# Patient Record
Sex: Male | Born: 1995 | Race: Black or African American | Hispanic: No | Marital: Single | Smoking: Never smoker
Health system: Southern US, Community
[De-identification: ages and names within clinical notes are randomized; demographics above are authoritative.]

## PROBLEM LIST (undated history)

## (undated) DIAGNOSIS — J45909 Unspecified asthma, uncomplicated: Secondary | ICD-10-CM

## (undated) HISTORY — PX: OTHER SURGICAL HISTORY: SHX169

---

## 2016-11-11 ENCOUNTER — Emergency Department (HOSPITAL_COMMUNITY)
Admission: EM | Admit: 2016-11-11 | Discharge: 2016-11-12 | Disposition: A | Discharge status: Home / Self Care | Payer: BLUE CROSS/BLUE SHIELD | Attending: Emergency Medicine | Admitting: Emergency Medicine

## 2016-11-11 ENCOUNTER — Encounter (HOSPITAL_COMMUNITY): Payer: Self-pay

## 2016-11-11 DIAGNOSIS — J45909 Unspecified asthma, uncomplicated: Secondary | ICD-10-CM | POA: Insufficient documentation

## 2016-11-11 DIAGNOSIS — R202 Paresthesia of skin: Secondary | ICD-10-CM | POA: Diagnosis not present

## 2016-11-11 DIAGNOSIS — R200 Anesthesia of skin: Secondary | ICD-10-CM | POA: Insufficient documentation

## 2016-11-11 HISTORY — DX: Unspecified asthma, uncomplicated: J45.909

## 2016-11-11 NOTE — ED Triage Notes (Signed)
States since noon today left leg numbness and pain able to bear weight steady gait noted and left hand tingling off and on clear speech noted moves all extremities. Pain in leg worse with sitting.

## 2016-11-11 NOTE — ED Notes (Signed)
Pt c/o left side numbness (face, leg, arm) onset 12:00 today. He has good pulse and movement in the extremities. He stated, "I can feel if I touch my arm and leg, but it feels numb on the inside."

## 2016-11-12 ENCOUNTER — Emergency Department (HOSPITAL_COMMUNITY): Payer: BLUE CROSS/BLUE SHIELD

## 2016-11-12 LAB — CBC WITH DIFFERENTIAL/PLATELET
BASOS ABS: 0 10*3/uL (ref 0.0–0.1)
BASOS PCT: 0 %
EOS ABS: 0.1 10*3/uL (ref 0.0–0.7)
Eosinophils Relative: 2 %
HEMATOCRIT: 46.2 % (ref 39.0–52.0)
Hemoglobin: 16 g/dL (ref 13.0–17.0)
Lymphocytes Relative: 53 %
Lymphs Abs: 3.3 10*3/uL (ref 0.7–4.0)
MCH: 31.9 pg (ref 26.0–34.0)
MCHC: 34.6 g/dL (ref 30.0–36.0)
MCV: 92.2 fL (ref 78.0–100.0)
MONO ABS: 0.5 10*3/uL (ref 0.1–1.0)
Monocytes Relative: 8 %
NEUTROS ABS: 2.3 10*3/uL (ref 1.7–7.7)
Neutrophils Relative %: 37 %
PLATELETS: 229 10*3/uL (ref 150–400)
RBC: 5.01 MIL/uL (ref 4.22–5.81)
RDW: 11.8 % (ref 11.5–15.5)
WBC: 6.2 10*3/uL (ref 4.0–10.5)

## 2016-11-12 LAB — URINALYSIS, ROUTINE W REFLEX MICROSCOPIC
BILIRUBIN URINE: NEGATIVE
Glucose, UA: NEGATIVE mg/dL
Hgb urine dipstick: NEGATIVE
KETONES UR: NEGATIVE mg/dL
LEUKOCYTES UA: NEGATIVE
NITRITE: NEGATIVE
PROTEIN: NEGATIVE mg/dL
Specific Gravity, Urine: 1.018 (ref 1.005–1.030)
pH: 6 (ref 5.0–8.0)

## 2016-11-12 LAB — RAPID URINE DRUG SCREEN, HOSP PERFORMED
Amphetamines: NOT DETECTED
BENZODIAZEPINES: NOT DETECTED
Barbiturates: NOT DETECTED
COCAINE: NOT DETECTED
OPIATES: NOT DETECTED
Tetrahydrocannabinol: NOT DETECTED

## 2016-11-12 LAB — BASIC METABOLIC PANEL
ANION GAP: 7 (ref 5–15)
BUN: 12 mg/dL (ref 6–20)
CALCIUM: 9.6 mg/dL (ref 8.9–10.3)
CO2: 28 mmol/L (ref 22–32)
CREATININE: 0.84 mg/dL (ref 0.61–1.24)
Chloride: 102 mmol/L (ref 101–111)
GFR calc Af Amer: >60 mL/min (ref >60)
GFR calc non Af Amer: >60 mL/min (ref >60)
Glucose, Bld: 91 mg/dL (ref 65–99)
Potassium: 3.7 mmol/L (ref 3.5–5.1)
Sodium: 137 mmol/L (ref 135–145)

## 2016-11-12 MED ORDER — LORAZEPAM 2 MG/ML IJ SOLN
1.0000 mg | Freq: Once | INTRAMUSCULAR | Status: AC
  Administered 2016-11-12: 1 mg via INTRAVENOUS
  Filled 2016-11-12: qty 1

## 2016-11-12 MED ORDER — SODIUM CHLORIDE 0.9 % IV BOLUS (SEPSIS)
1000.0000 mL | Freq: Once | INTRAVENOUS | Status: AC
  Administered 2016-11-12: 1000 mL via INTRAVENOUS

## 2016-11-12 NOTE — ED Notes (Signed)
[facility] here to transport pt to [facility]. 

## 2016-11-12 NOTE — ED Notes (Signed)
[facility] called and was given report on pt.

## 2016-11-12 NOTE — ED Notes (Signed)
[facility] was notified of pt's need of transportation to [facility]. 

## 2016-11-12 NOTE — ED Provider Notes (Signed)
TIME SEEN: 12:42 AM  CHIEF COMPLAINT: Left-sided numbness  HPI: Patient is a 21 year old male with history of asthma and seasonal allergies who presents emergency department with left leg numbness that started at noon today. Numbness involves the entire left leg. Has also had intermittent numbness of the left face and left arm that has currently resolved. Has had mild left-sided headache. No numbness or weakness of the right side. No history of stroke. No history of MS. No family history of MS, brain tumor, early stroke. He denies head injury. No neck or back injury. No neck or back pain. No bowel or bladder incontinence. No fever. Otherwise has been feeling well.  ROS: See HPI Constitutional: no fever  Eyes: no drainage  ENT: no runny nose   Cardiovascular:  no chest pain  Resp: no SOB  GI: no vomiting GU: no dysuria Integumentary: no rash  Allergy: no hives  Musculoskeletal: no leg swelling  Neurological: no slurred speech ROS otherwise negative  PAST MEDICAL HISTORY/PAST SURGICAL HISTORY:  Past Medical History:  Diagnosis Date  . Asthma     MEDICATIONS:  Prior to Admission medications   Not on File    ALLERGIES:  No Known Allergies  SOCIAL HISTORY:  Social History  Substance Use Topics  . Smoking status: Never Smoker  . Smokeless tobacco: Never Used  . Alcohol use Yes    FAMILY HISTORY: History reviewed. No pertinent family history.  EXAM: BP 115/84 (BP Location: Left Arm)   Pulse (!) 51   Temp 97.8 F (36.6 C) (Oral)   Resp 15   Ht 6\' 1"  (1.854 m)   Wt 77.6 kg (171 lb)   SpO2 100%   BMI 22.56 kg/m  CONSTITUTIONAL: Alert and oriented and responds appropriately to questions. Well-appearing; well-nourished HEAD: Normocephalic EYES: Conjunctivae clear, pupils appear equal, EOMI ENT: normal nose; moist mucous membranes NECK: Supple, no meningismus, no nuchal rigidity, no LAD  CARD: RRR; S1 and S2 appreciated; no murmurs, no clicks, no rubs, no  gallops RESP: Normal chest excursion without splinting or tachypnea; breath sounds clear and equal bilaterally; no wheezes, no rhonchi, no rales, no hypoxia or respiratory distress, speaking full sentences ABD/GI: Normal bowel sounds; non-distended; soft, non-tender, no rebound, no guarding, no peritoneal signs, no hepatosplenomegaly BACK:  The back appears normal and is non-tender to palpation, there is no CVA tenderness EXT: Normal ROM in all joints; non-tender to palpation; no edema; normal capillary refill; no cyanosis, no calf tenderness or swelling    SKIN: Normal color for age and race; warm; no rash NEURO: Moves all extremities equally, strength 5/5 in all 4 extremities, 2+ deep tendon reflexes in bilateral upper lower extreme is, no clonus, normal gait, normal speech, cranial nerves II through XII intact, sensation in the left leg is diminished compared to the right but otherwise sensation in the arms and face is normal PSYCH: The patient's mood and manner are appropriate. Grooming and personal hygiene are appropriate.  MEDICAL DECISION MAKING: Patient here with left-sided numbness. Intermittently involves the face and arm it is mostly the leg. Last seen normal at noon yesterday. He is not a TPA candidate given onset of symptoms 12 hours ago. Does not meet criteria for large vessel occlusion. I feel he needs an MRI of his brain for further evaluation. I feel this can be done without contrast. He will be transferred to [facility]. He agrees with this plan. We'll obtain screening labs, urine. He declines pain medication for his headache at  this time. Describes the headache is very "mild" like he "has not drank enough water today".  No history of migraines.  ED PROGRESS:   12:40 AM  D/w Dr[facility] Preston [facility]Glick [facility] department who agrees to accept patient in transfer. If MRI brain negative, patient will be discharged with outpatient neurology follow-up. Patient comfortable with  this plan.   I reviewed all nursing notes, vitals, pertinent previous records, EKGs, lab and urine results, imaging (as available).    Tyshawn Ciullo, Layla MawKristen N, DO 11/12/16 [phone]

## 2016-11-12 NOTE — Discharge Instructions (Signed)
1. Medications: usual home medications 2. Treatment: rest, drink plenty of fluids,  3. Follow Up: Please followup with neurology in 3-5 days for discussion of your diagnoses and further evaluation after today's visit; if you do not have a primary care doctor use the resource guide provided to find one; Please return to the ER for worsening symptoms, difficulty walking, loss of bowel or bladder control

## 2016-11-12 NOTE — ED Notes (Signed)
Pt arrived via [facility] to hallway

## 2016-11-12 NOTE — ED Provider Notes (Signed)
[facility] EM[facility]GENCY [facility] Provider Note   CSN: [id number] Arrival date & time: 11/11/16  2040     History   Chief Complaint Chief Complaint  Patient presents with  . Leg Pain    HPI Jim DesanctisSteve Knobel is a 21 y.o. male with a hx of Asthma and seasonal allergies presents to the Emergency Department complaining of gradual, persistent, progressively worsening left leg numbness onset around noon today. He has had intermittent numbness of the left face and left arm that has resolved. Patient denies a history of stroke or MS. He was [facility] earlier tonight and transferred here to [facility] for [facility].  The history is provided by the patient and medical records. No language interpreter was used.    Past Medical History:  Diagnosis Date  . Asthma     There are no active problems to display for this patient.   Past Surgical History:  Procedure Laterality Date  . headaches         Home Medications    Prior to Admission medications   Not on File    Family History History reviewed. No pertinent family history.  Social History Social History  Substance Use Topics  . Smoking status: Never Smoker  . Smokeless tobacco: Never Used  . Alcohol use Yes     Allergies   Patient has no known allergies.   Review of Systems Review of Systems  Constitutional: Negative for appetite change, diaphoresis, fatigue, fever and unexpected weight change.  HENT: Negative for mouth sores.   Eyes: Negative for visual disturbance.  Respiratory: Negative for cough, chest tightness, shortness of breath and wheezing.   Cardiovascular: Negative for chest pain.  Gastrointestinal: Negative for abdominal pain, constipation, diarrhea, nausea and vomiting.  Endocrine: Negative for polydipsia, polyphagia and polyuria.  Genitourinary: Negative for dysuria, frequency, hematuria and urgency.  Musculoskeletal: Negative for back pain and neck stiffness.    Skin: Negative for rash.  Allergic/Immunologic: Negative for immunocompromised state.  Neurological: Positive for numbness and headaches. Negative for syncope and light-headedness.  Hematological: Does not bruise/bleed easily.  Psychiatric/Behavioral: Negative for sleep disturbance. The patient is not nervous/anxious.      Physical Exam Updated Vital Signs BP 121/78   Pulse (!) 51   Temp 97.8 F (36.6 C) (Oral)   Resp 18   Ht 6\' 1"  (1.854 m)   Wt 77.6 kg (171 lb)   SpO2 100%   BMI 22.56 kg/m   Physical Exam  Constitutional: He is oriented to person, place, and time. He appears well-developed and well-nourished. No distress.  HENT:  Head: Normocephalic and atraumatic.  Mouth/Throat: Oropharynx is clear and moist.  Eyes: Pupils are equal, round, and reactive to light. Conjunctivae and EOM are normal. No scleral icterus.  No horizontal, vertical or rotational nystagmus  Neck: Normal range of motion. Neck supple.  Full active and passive ROM without pain No midline or paraspinal tenderness No nuchal rigidity or meningeal signs  Cardiovascular: Normal rate, regular rhythm and intact distal pulses.   Pulmonary/Chest: Effort normal and breath sounds normal. No respiratory distress. He has no wheezes. He has no rales.  Abdominal: Soft. Bowel sounds are normal. There is no tenderness. There is no rebound and no guarding.  Musculoskeletal: Normal range of motion.  Lymphadenopathy:    He has no cervical adenopathy.  Neurological: He is alert and oriented to person, place, and time. No cranial nerve deficit. He exhibits normal muscle tone. Coordination normal.  Mental Status:  Alert, oriented, thought content appropriate. Speech fluent without evidence of aphasia. Able to follow 2 step commands without difficulty.  Cranial Nerves:  II:  Peripheral visual fields grossly normal, pupils equal, round, reactive to light III,IV, VI: ptosis not present, extra-ocular motions intact  bilaterally  V,VII: smile symmetric, facial light touch sensation equal VIII: hearing grossly normal bilaterally  IX,X: midline uvula rise  XI: bilateral shoulder shrug equal and strong XII: midline tongue extension  Motor:  5/5 in upper and lower extremities bilaterally including strong and equal grip strength and dorsiflexion/plantar flexion Sensory: normal touch bilateral face and upper extremities, reports slightly decreased touch to the left lower extremity when compared to the right lower.  Gait: gait testing deferred CV: distal pulses palpable throughout   Skin: Skin is warm and dry. No rash noted. He is not diaphoretic.  Psychiatric: He has a normal mood and affect. His behavior is normal. Judgment and thought content normal.  Nursing note and vitals reviewed.    ED Treatments / Results  Labs (all labs ordered are listed, but only abnormal results are displayed) Labs Reviewed  CBC WITH DIFFERENTIAL/PLATELET  BASIC METABOLIC PANEL  URINALYSIS, ROUTINE W REFLEX MICROSCOPIC  RAPID URINE DRUG SCREEN, HOSP PERFORMED     Radiology Mr Brain Wo Contrast  Result Date: 11/12/2016 CLINICAL DATA:  21 y/o M; left leg numbness and pain. On and off left hand tingling. EXAM: MRI HEAD WITHOUT CONTRAST TECHNIQUE: Multiplanar, multiecho pulse sequences of the brain and surrounding structures were obtained without intravenous contrast. COMPARISON:  None. FINDINGS: Brain: No acute infarction, hemorrhage, hydrocephalus, extra-axial collection or mass lesion. Vascular: Normal flow voids. Skull and upper cervical spine: Normal marrow signal. Sinuses/Orbits: Negative. Other: None. IMPRESSION: Normal MRI of the brain. Electronically Signed   By: Mitzi Hansen M.D.   On: 11/12/2016 03:41    Procedures Procedures (including critical care time)  Medications Ordered in ED Medications  sodium chloride 0.9 % bolus 1,000 mL (0 mLs Intravenous Stopped 11/12/16 0242)  LORazepam (ATIVAN)  injection 1 mg (1 mg Intravenous Given 11/12/16 0230)     Initial Impression / Assessment and Plan / ED Course  I have reviewed the triage vital signs and the nursing notes.  Pertinent labs & imaging results that were available during my care of the patient were reviewed by me and considered in my medical decision making (see chart for details).     Patient presents as a transfer from that center [facility] with paresthesias of the left leg. Subjectively decreased sensation to the but no other neurologic deficits. MRI without acute abnormality.No evidence of stroke, white matter lesions or tumors. Patient is able to ambulate here. Discussed conservative therapies at home and close follow-up with neurology. Also discussed reasons to return immediately to the emergency department. Patient states understanding and is in agreement with the plan.  Final Clinical Impressions(s) / ED Diagnoses   Final diagnoses:  Left sided numbness  Paresthesias    New Prescriptions New Prescriptions   No medications on file     Milta Deiters 11/12/16 [id number]    Dione Booze, MD 11/12/16 [phone]

## 2016-12-19 ENCOUNTER — Emergency Department (HOSPITAL_COMMUNITY): Payer: BLUE CROSS/BLUE SHIELD

## 2016-12-19 ENCOUNTER — Emergency Department (HOSPITAL_COMMUNITY)
Admission: EM | Admit: 2016-12-19 | Discharge: 2016-12-19 | Disposition: A | Discharge status: Home / Self Care | Payer: BLUE CROSS/BLUE SHIELD | Attending: Emergency Medicine | Admitting: Emergency Medicine

## 2016-12-19 ENCOUNTER — Other Ambulatory Visit: Payer: Self-pay

## 2016-12-19 ENCOUNTER — Encounter (HOSPITAL_COMMUNITY): Payer: Self-pay | Admitting: Emergency Medicine

## 2016-12-19 ENCOUNTER — Emergency Department (HOSPITAL_BASED_OUTPATIENT_CLINIC_OR_DEPARTMENT_OTHER): Payer: BLUE CROSS/BLUE SHIELD

## 2016-12-19 DIAGNOSIS — I301 Infective pericarditis: Secondary | ICD-10-CM | POA: Insufficient documentation

## 2016-12-19 DIAGNOSIS — J45909 Unspecified asthma, uncomplicated: Secondary | ICD-10-CM | POA: Insufficient documentation

## 2016-12-19 DIAGNOSIS — I309 Acute pericarditis, unspecified: Secondary | ICD-10-CM | POA: Diagnosis not present

## 2016-12-19 DIAGNOSIS — R9431 Abnormal electrocardiogram [ECG] [EKG]: Secondary | ICD-10-CM

## 2016-12-19 DIAGNOSIS — R079 Chest pain, unspecified: Secondary | ICD-10-CM | POA: Diagnosis present

## 2016-12-19 DIAGNOSIS — R071 Chest pain on breathing: Secondary | ICD-10-CM | POA: Diagnosis not present

## 2016-12-19 LAB — BASIC METABOLIC PANEL
ANION GAP: 7 (ref 5–15)
BUN: 12 mg/dL (ref 6–20)
CALCIUM: 9.6 mg/dL (ref 8.9–10.3)
CO2: 27 mmol/L (ref 22–32)
Chloride: 105 mmol/L (ref 101–111)
Creatinine, Ser: 0.78 mg/dL (ref 0.61–1.24)
Glucose, Bld: 88 mg/dL (ref 65–99)
POTASSIUM: 3.7 mmol/L (ref 3.5–5.1)
Sodium: 139 mmol/L (ref 135–145)

## 2016-12-19 LAB — I-STAT TROPONIN, ED: TROPONIN I, POC: 0.13 ng/mL — AB (ref 0.00–0.08)

## 2016-12-19 LAB — CBC
HCT: 45.2 % (ref 39.0–52.0)
HEMOGLOBIN: 14.9 g/dL (ref 13.0–17.0)
MCH: 30.8 pg (ref 26.0–34.0)
MCHC: 33 g/dL (ref 30.0–36.0)
MCV: 93.4 fL (ref 78.0–100.0)
Platelets: 231 10*3/uL (ref 150–400)
RBC: 4.84 MIL/uL (ref 4.22–5.81)
RDW: 11.8 % (ref 11.5–15.5)
WBC: 5.3 10*3/uL (ref 4.0–10.5)

## 2016-12-19 LAB — ECHOCARDIOGRAM COMPLETE
Height: 73 in
WEIGHTICAEL: 2736 [oz_av]

## 2016-12-19 LAB — TROPONIN I: TROPONIN I: 0.12 ng/mL — AB (ref <0.03)

## 2016-12-19 MED ORDER — IBUPROFEN 400 MG PO TABS: 400.0000 mg | ORAL_TABLET | Freq: Two times a day (BID) | ORAL | 0 refills | Status: AC

## 2016-12-19 MED ORDER — COLCHICINE 0.6 MG PO TABS: 0.6000 mg | ORAL_TABLET | Freq: Two times a day (BID) | ORAL | 0 refills | Status: AC

## 2016-12-19 MED ORDER — COLCHICINE 0.6 MG PO TABS
0.6000 mg | ORAL_TABLET | Freq: Two times a day (BID) | ORAL | Status: DC
  Administered 2016-12-19: 0.6 mg via ORAL
  Filled 2016-12-19: qty 1

## 2016-12-19 MED ORDER — IBUPROFEN 200 MG PO TABS
600.0000 mg | ORAL_TABLET | Freq: Once | ORAL | Status: AC
  Administered 2016-12-19: 600 mg via ORAL
  Filled 2016-12-19: qty 3

## 2016-12-19 MED ORDER — IBUPROFEN 200 MG PO TABS: 400.0000 mg | ORAL_TABLET | Freq: Two times a day (BID) | ORAL | Status: DC

## 2016-12-19 MED ORDER — ASPIRIN 81 MG PO CHEW: 324.0000 mg | CHEWABLE_TABLET | Freq: Once | ORAL | Status: DC

## 2016-12-19 NOTE — ED Provider Notes (Signed)
Patient has been seen by cardiology and prescribed medications and given referral information   Lorre NickAllen, Tesa Meadors, MD 12/19/16 1318

## 2016-12-19 NOTE — ED Notes (Signed)
Delay in discharge, waiting for colchicine prescription and tablet to arrive to the ED

## 2016-12-19 NOTE — Consult Note (Signed)
Cardiology Consultation:   Patient ID: Mitchell Crosby; [id number]; 12-16-1995   Admit date: 12/19/2016 Date of Consult: 12/19/2016  Primary Care Provider: Patient, No Pcp Per Primary Cardiologist: New- Dr. Delton See   Patient Profile:   Mitchell Crosby is a 21 y.o. male with a hx of asthma who is being seen today for the evaluation of chest pain at the request of Dr. Freida Busman.  History of Present Illness:   Mr. Damore has no previous history of CAD or PE. He is a Education officer, museum at [employer]. Last week he developed headache, body aches and dry throat. He went to student health and was told that he had a viral illness. 2 nights ago he developed left sided chest pain that felt like waves of pain lasting for a few seconds at a time and then developed associated left arm tingling. This pain bothered him to the point that he was repeatedly awakened about every thirty minutes at which time he would get up and walk around and the pain resolved. He has had no pain during the day when he is up and active. Last night the pain became much worse. He has had a similar episode of chest pain when he was in high school when he had a viral illness, that self resolved without treatment.   Troponin is midly elevated at 0.12. EKG shows diffuse ST elevations suggesting acute pericarditis.   Mr. Dalziel drinks alcohol socially, liquor or wine on the weekends. He does not use tobacco but occ smokes marijuana, last use in September. The patient is not aware of any cardiac family history in his parents, although he thinks his mother sees a cardiologist.    Past Medical History:  Diagnosis Date  . Asthma     Past Surgical History:  Procedure Laterality Date  . headaches       Home Medications:  Prior to Admission medications   Not on File    Inpatient Medications: Scheduled Meds:  Continuous Infusions:  PRN Meds:   Allergies:   No Known Allergies  Social History:   Social History    Socioeconomic History  . Marital status: Single    Spouse name: Not on file  . Number of children: Not on file  . Years of education: Not on file  . Highest education level: Not on file  Social Needs  . Financial resource strain: Not on file  . Food insecurity - worry: Not on file  . Food insecurity - inability: Not on file  . Transportation needs - medical: Not on file  . Transportation needs - non-medical: Not on file  Occupational History  . Not on file  Tobacco Use  . Smoking status: Never Smoker  . Smokeless tobacco: Never Used  Substance and Sexual Activity  . Alcohol use: Yes    Comment: Socially on weekends  . Drug use: Yes    Types: Marijuana    Comment: Occasional- last use in 09/2016  . Sexual activity: Not on file  Other Topics Concern  . Not on file  Social History Narrative  . Not on file    Family History:    Family History  Problem Relation Age of Onset  . Hypertension Mother      ROS:  Please see the history of present illness.  ROS  All other ROS reviewed and negative.     Physical Exam/Data:   Vitals:   12/19/16 0700 12/19/16 0730 12/19/16 0800 12/19/16 0830  BP: 102/84 123/83 114/65 129/81  Pulse: 69 (!) 56 (!) 58 70  Resp: (!) 21 16 14  (!) 21  Temp:      TempSrc:      SpO2: 100% 100% 100% 98%  Weight:      Height:       No intake or output data in the 24 hours ending 12/19/16 0857 Filed Weights   12/19/16 0425  Weight: 171 lb (77.6 kg)   Body mass index is 22.56 kg/m.  General:  Well nourished, well developed, in no acute distress HEENT: normal Lymph: no adenopathy Neck: no JVD Endocrine:  No thryomegaly Vascular: No carotid bruits; FA pulses 2+ bilaterally without bruits  Cardiac:  normal S1, S2; RRR; no murmur  Lungs:  clear to auscultation bilaterally, no wheezing, rhonchi or rales  Abd: soft, nontender, no hepatomegaly  Ext: no edema Musculoskeletal:  No deformities, BUE and BLE strength normal and equal Skin: warm  and dry  Neuro:  CNs 2-12 intact, no focal abnormalities noted Psych:  Normal affect   EKG:  The EKG was personally reviewed and demonstrates:  sinus bradycardia with  diffuse ST elevations suggesting acute pericarditis, 53 bpm.  Telemetry:  Telemetry was personally reviewed and demonstrates:  SB/SR 50's-60's  Relevant CV Studies:  Echo pending  Laboratory Data:  Chemistry Recent Labs  Lab 12/19/16 0507  NA 139  K 3.7  CL 105  CO2 27  GLUCOSE 88  BUN 12  CREATININE 0.78  CALCIUM 9.6  GFRNONAA >60  GFRAA >60  ANIONGAP 7    No results for input(s): PROT, ALBUMIN, AST, ALT, ALKPHOS, BILITOT in the last 168 hours. Hematology Recent Labs  Lab 12/19/16 0507  WBC 5.3  RBC 4.84  HGB 14.9  HCT 45.2  MCV 93.4  MCH 30.8  MCHC 33.0  RDW 11.8  PLT 231   Cardiac Enzymes Recent Labs  Lab 12/19/16 0507  TROPONINI 0.12*    Recent Labs  Lab 12/19/16 0519  TROPIPOC 0.13*    BNPNo results for input(s): BNP, PROBNP in the last 168 hours.  DDimer No results for input(s): DDIMER in the last 168 hours.  Radiology/Studies:  Dg Chest 2 View  Result Date: 12/19/2016 CLINICAL DATA:  LEFT chest pain for two nights while lying down. EXAM: CHEST  2 VIEW COMPARISON:  None. FINDINGS: Cardiomediastinal silhouette is normal. No pleural effusions or focal consolidations. Trachea projects midline and there is no pneumothorax. Soft tissue planes and included osseous structures are non-suspicious. Mild dextroscoliosis IMPRESSION: Negative. Electronically Signed   By: Awilda Metroourtnay  Bloomer M.D.   On: 12/19/2016 05:52    Assessment and Plan:   Chest pain -Pt with recent viral illness, headache/body aches. Developed waves of left chest pain 2 nights ago that occurs only when he lays down that has made him unable to sleep.  -No current chest pain. Hemodynamically stable.  -EKG with diffuse ST elevations suggesting acute pericarditis. Pt with recent viral illness last week. Mildly elevated  troponin of 0.13.  -Likely myopericarditis. Will check echo. Will consider antiinflammatory therapy and PPI for 3 months with activity restrictions for the next 3 months. Follow up in our office in 6 weeks.  -Dr. Delton SeeNelson to review echo and make ultimate recommendations.   For questions or updates, please contact [facility] Please consult [email or web address] for contact info under Cardiology/STEMI.   Signed, Berton BonJanine Hammond, NP  12/19/2016 8:57 AM   The patient was seen, examined and discussed with Berton BonJanine Hammond, NP-C and I agree with the above.   A  21 y.o. male with a hx of asthma who is being seen today for the evaluation of chest pain. 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 Last week he developed URI, student health was told that he had a viral illness. 2 nights ago he developed left sided chest pain that felt like waves of pain lasting for a few seconds at a time and then developed associated left arm tingling, worse when he lays on  His back , better when upright.  ECG shows SR, diffuse ST elevations in the a pattern of acue pericarditis, troponin is minimally elevated consistent with a mild case of an acute myopericarditis. LVEF 55%, with no regional wall motion abnormalities, no pericardial effusion.   We will start ibuprofen 400 mg po BID x 7 days and colchicine 0.6 mg PO BID x 3 months. We will follow up in the clinic. He can be discharged from the ER.   Tobias AlexanderKatarina Juniel Groene, MD 12/19/2016

## 2016-12-19 NOTE — ED Notes (Signed)
I gave critical I stat cTnl result to [facility]

## 2016-12-19 NOTE — ED Provider Notes (Signed)
[facility] Provider Note   CSN: [id number] Arrival date & time: 12/19/16  0413     History   Chief Complaint Chief Complaint  Patient presents with  . Chest Pain    HPI Jim DesanctisSteve Beshara is a 21 y.o. male.  The history is provided by the patient.  Chest Pain   This is a new problem. The current episode started yesterday. The problem has been gradually improving. Pain location: left chest. The pain is moderate. The quality of the pain is described as pressure-like. The symptoms are aggravated by certain positions. Associated symptoms include shortness of breath. Pertinent negatives include no fever and no hemoptysis. He has tried nothing for the symptoms. Risk factors include male gender.  Pertinent negatives for past medical history include no CAD and no PE.   Patient reports for the past day he has had episodes of left-sided chest pain He reports that times when he has the chest pain his left arm goes numb No pleuritic pain No fever/vomiting No recent travel No history of CAD or PE He is a non-smoker Reports at times the pain is improved with movement however does get worse when he lies flat  He reports he is currently chest pain-free  Patient does report he had a recent viral illness last week during the Thanksgiving holiday.  Past Medical History:  Diagnosis Date  . Asthma     There are no active problems to display for this patient.   Past Surgical History:  Procedure Laterality Date  . headaches         Home Medications    Prior to Admission medications   Not on File    Family History No family history on file.  Social History Social History   Tobacco Use  . Smoking status: Never Smoker  . Smokeless tobacco: Never Used  Substance Use Topics  . Alcohol use: Yes  . Drug use: No     Allergies   Patient has no known allergies.   Review of Systems Review of Systems  Constitutional: Negative for fever.    Respiratory: Positive for shortness of breath. Negative for hemoptysis.   Cardiovascular: Positive for chest pain.  Neurological: Negative for syncope.  All other systems reviewed and are negative.    Physical Exam Updated Vital Signs BP 125/69   Pulse (!) 53   Temp 98.2 F (36.8 C) (Oral)   Resp 10   Ht 1.854 m (6\' 1" )   Wt 77.6 kg (171 lb)   SpO2 100%   BMI 22.56 kg/m   Physical Exam CONSTITUTIONAL: Well developed/well nourished HEAD: Normocephalic/atraumatic EYES: EOMI/PERRL ENMT: Mucous membranes moist NECK: supple no meningeal signs SPINE/BACK:entire spine nontender CV: S1/S2 noted, no murmurs/rubs/gallops noted LUNGS: Lungs are clear to auscultation bilaterally, no apparent distress ABDOMEN: soft, nontender, no rebound or guarding, bowel sounds noted throughout abdomen GU:no cva tenderness NEURO: Pt is awake/alert/appropriate, moves all extremitiesx4.  No facial droop.  No focal weakness in the upper and lower extremities, no focal sensory deficit in upper or lower extremity EXTREMITIES: pulses normal/equal, full ROM, no lower extremity edema or tenderness SKIN: warm, color normal PSYCH: no abnormalities of mood noted, alert and oriented to situation   ED Treatments / Results  Labs (all labs ordered are listed, but only abnormal results are displayed) Labs Reviewed  TROPONIN I - Abnormal; Notable for the following components:      Result Value   Troponin I 0.12 (*)    All other  components within normal limits  I-STAT TROPONIN, ED - Abnormal; Notable for the following components:   Troponin i, poc 0.13 (*)    All other components within normal limits  BASIC METABOLIC PANEL  CBC    EKG  EKG Interpretation  Date/Time:  Wednesday December 19 2016 05:32:37 EST Ventricular Rate:  53 PR Interval:    QRS Duration: 104 QT Interval:  398 QTC Calculation: 374 R Axis:   88 Text Interpretation:  Sinus rhythm Borderline Q waves in lateral leads ST elevation  suggests acute pericarditis Confirmed by Zadie RhineWickline, Tymeshia Awan ([id number]) on 12/19/2016 5:48:34 AM       Radiology Dg Chest 2 View  Result Date: 12/19/2016 CLINICAL DATA:  LEFT chest pain for two nights while lying down. EXAM: CHEST  2 VIEW COMPARISON:  None. FINDINGS: Cardiomediastinal silhouette is normal. No pleural effusions or focal consolidations. Trachea projects midline and there is no pneumothorax. Soft tissue planes and included osseous structures are non-suspicious. Mild dextroscoliosis IMPRESSION: Negative. Electronically Signed   By: Awilda Metroourtnay  Bloomer M.D.   On: 12/19/2016 05:52    Procedures Procedures   Medications Ordered in ED Medications  ibuprofen (ADVIL,MOTRIN) tablet 600 mg (600 mg Oral Given 12/19/16 0540)     Initial Impression / Assessment and Plan / ED Course  I have reviewed the triage vital signs and the nursing notes.  Pertinent labs & imaging results that were available during my care of the patient were reviewed by me and considered in my medical decision making (see chart for details).    5:45 AM Patient emergency department for chest pain that seems to be worse with lying flat. Patient is currently chest pain-free EKG does reflect possible pericarditis, his troponin is elevated I do not feel this is a STEMI at this time Imaging labs pending 6:58 AM Patient awake and alert, no distress noted Patient does report he had another episode of chest pain while lying flat  EKG consistent with diffuse ST elevation probable pericarditis He does have elevated troponin I am concerned for possible myocarditis, does endorse a recent viral illness that has resolved Discussed case with Dr. Eldridge DaceVaranasi with cardiology Plan to obtain stat echocardiogram in the emergency department, and cardiology will see patient in the emergency department  Patient has been updated on plan 7:38 AM Signed out to dr Freida Busmanallen with echo and cardiology evaluation pending  Final Clinical  Impressions(s) / ED Diagnoses   Final diagnoses:  Acute viral pericarditis    ED Discharge Orders    None       Zadie RhineWickline, Primrose Oler, MD 12/19/16 [phone]

## 2016-12-19 NOTE — ED Notes (Signed)
ECHO at bedside.

## 2016-12-19 NOTE — ED Notes (Signed)
Pt left before signature was obtained. Pt verbalized understanding of d/c instructions, follow up care, instruction on how to obtain a PCP, and prescriptions. A/Ox4, ambulatiory. All belongings with patient upon departure.

## 2016-12-19 NOTE — Progress Notes (Signed)
  Echocardiogram 2D Echocardiogram has been performed.  Leta JunglingCooper, Sherrick Araki M 12/19/2016, 8:50 AM

## 2016-12-19 NOTE — ED Triage Notes (Signed)
Pt coming from home with complaint of left sided chest pain. Pt states the pain is more severe when he tries to lay down to sleep, and that the pain goes away when he is walking around. Pt states he was seen here a few weeks ago because the left side of his body had gone numb. Was referred to a neurologist but has not been abel to make an appointment due to availability. Pt am,bulatory at this time with noe deficits. Pt A&O x 4.

## 2016-12-28 ENCOUNTER — Encounter: Payer: Self-pay | Admitting: Cardiology

## 2017-01-18 ENCOUNTER — Ambulatory Visit: Payer: BLUE CROSS/BLUE SHIELD | Admitting: Cardiology

## 2017-01-21 ENCOUNTER — Encounter: Payer: Self-pay | Admitting: Cardiology

## 2018-05-27 IMAGING — CR DG CHEST 2V
2 series · 2 of 2 positions shown · non-contrast
Comparison: None.

CLINICAL DATA: LEFT chest pain for two nights while lying down.

EXAM:
CHEST  2 VIEW

[w chest pa]
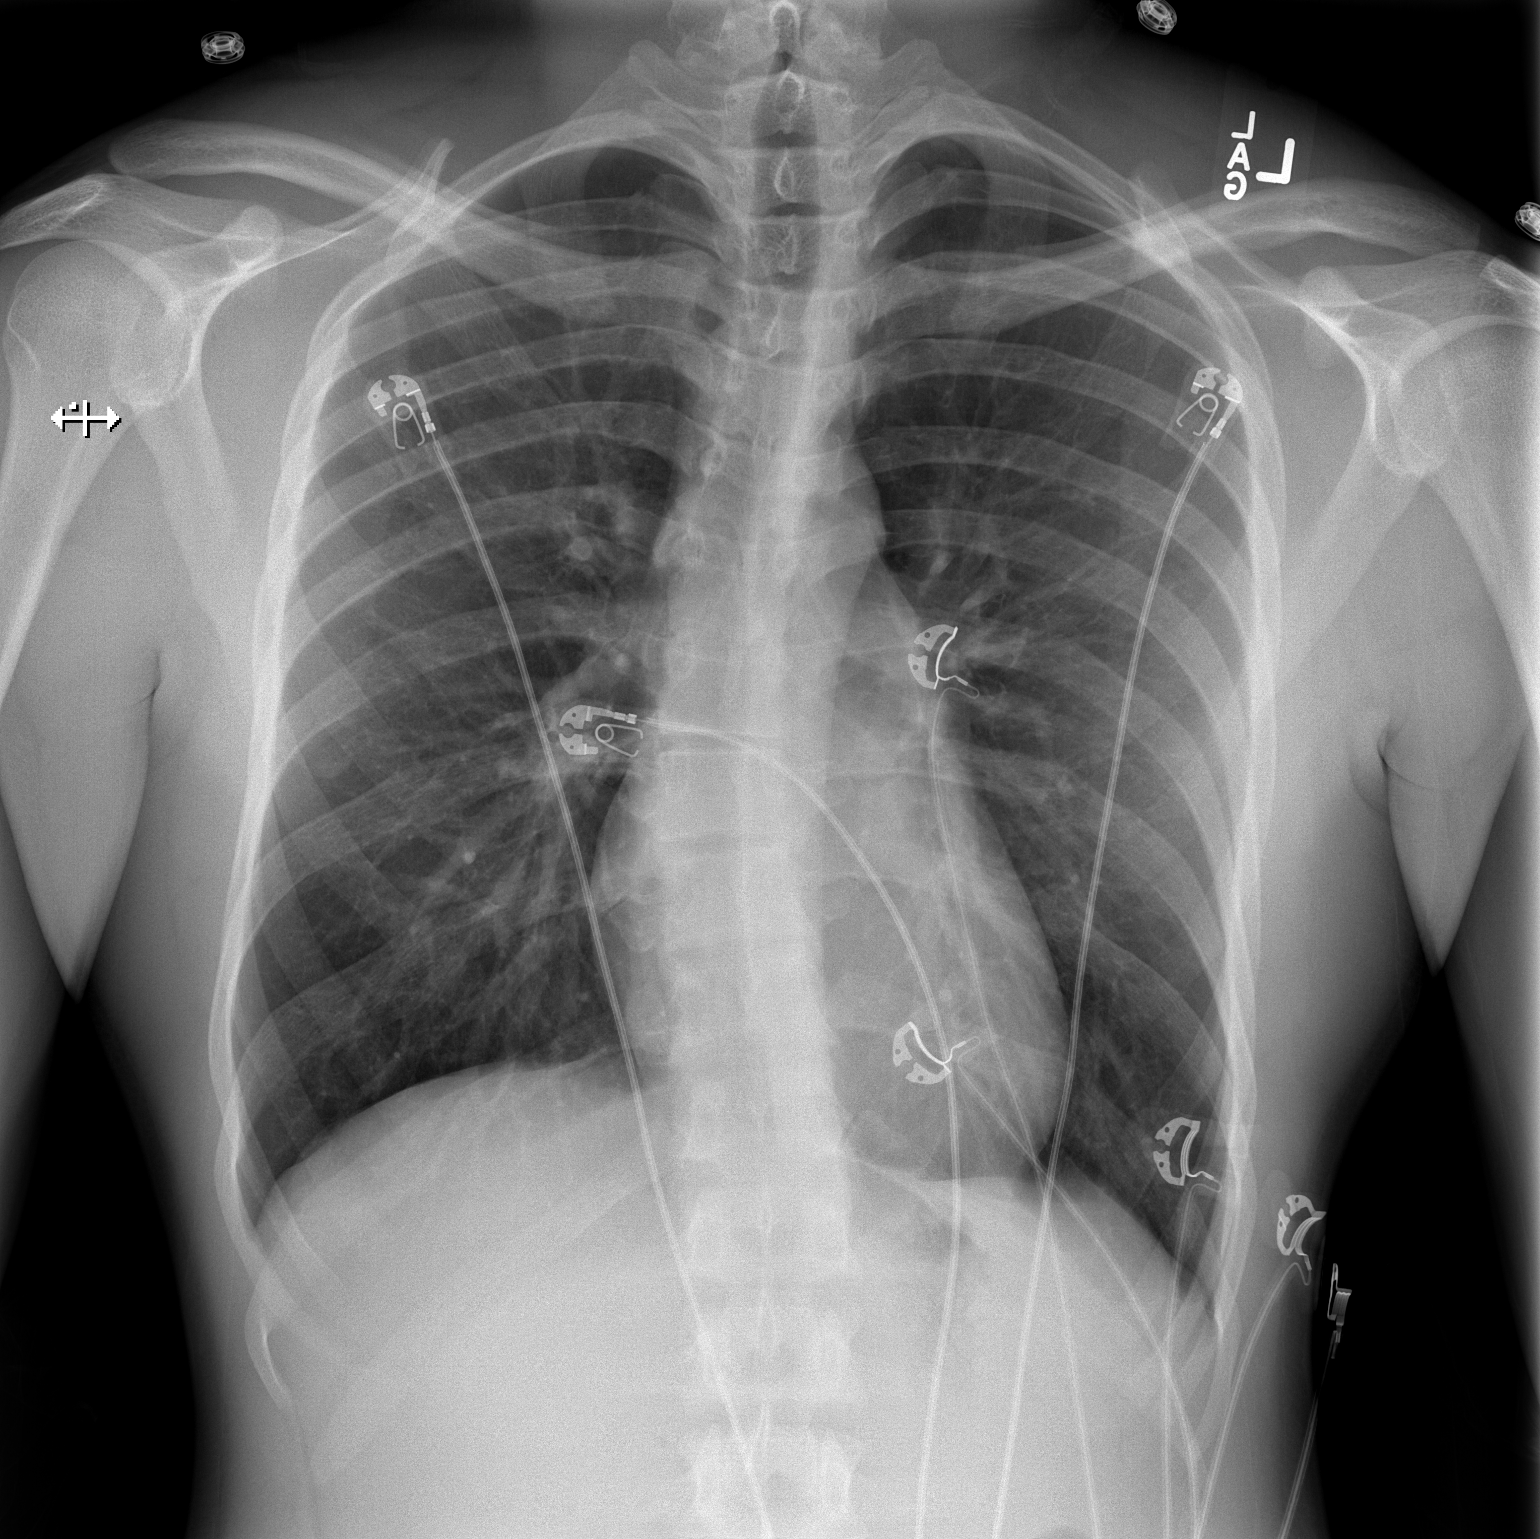

[w chest lat]
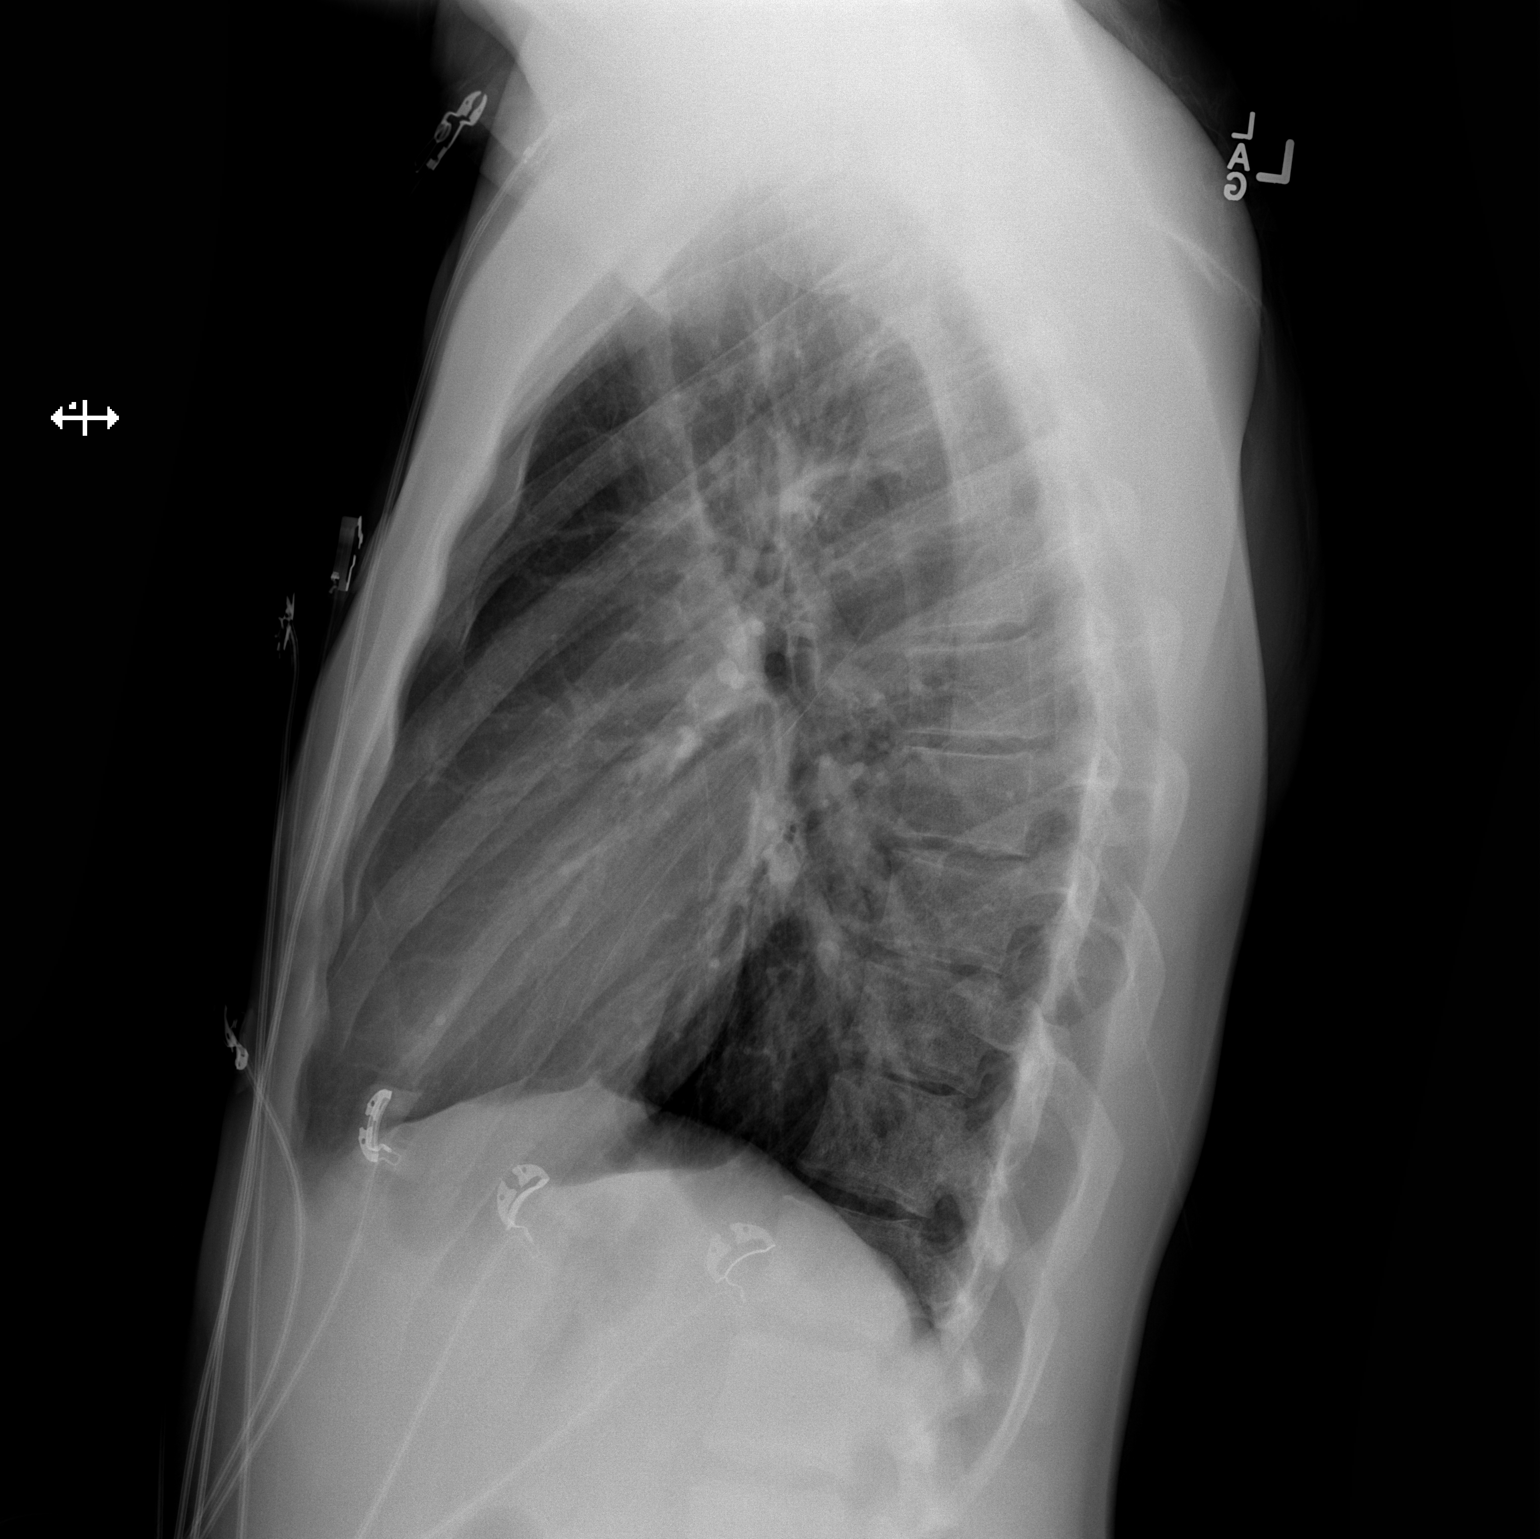

[2 of 2 positions shown; findings below may reference images not displayed]

FINDINGS: Cardiomediastinal silhouette is normal. No pleural effusions or
focal consolidations. Trachea projects midline and there is no
pneumothorax. Soft tissue planes and included osseous structures are
non-suspicious. Mild dextroscoliosis
IMPRESSION: Negative.
